# Patient Record
Sex: Female | Born: 1960 | Race: White | Hispanic: No | Marital: Single | Smoking: Current every day smoker
Health system: Southern US, Community
[De-identification: ages and names within clinical notes are randomized; demographics above are authoritative.]

## PROBLEM LIST (undated history)

## (undated) DIAGNOSIS — E079 Disorder of thyroid, unspecified: Secondary | ICD-10-CM

## (undated) HISTORY — PX: THYROIDECTOMY: SHX17

---

## 2002-09-26 ENCOUNTER — Emergency Department (HOSPITAL_COMMUNITY): Admission: EM | Admit: 2002-09-26 | Discharge: 2002-09-27 | Payer: Self-pay | Admitting: Emergency Medicine

## 2005-10-15 ENCOUNTER — Encounter: Admission: RE | Admit: 2005-10-15 | Discharge: 2005-10-15 | Payer: Self-pay | Admitting: Internal Medicine

## 2005-10-23 ENCOUNTER — Encounter: Admission: RE | Admit: 2005-10-23 | Discharge: 2005-10-23 | Payer: Self-pay | Admitting: Internal Medicine

## 2010-03-17 ENCOUNTER — Encounter: Payer: Self-pay | Admitting: Internal Medicine

## 2014-09-10 ENCOUNTER — Encounter (HOSPITAL_BASED_OUTPATIENT_CLINIC_OR_DEPARTMENT_OTHER): Payer: Self-pay | Admitting: Emergency Medicine

## 2014-09-10 ENCOUNTER — Emergency Department (HOSPITAL_BASED_OUTPATIENT_CLINIC_OR_DEPARTMENT_OTHER)
Admission: EM | Admit: 2014-09-10 | Discharge: 2014-09-10 | Disposition: A | Discharge status: Home / Self Care | Payer: Managed Care, Other (non HMO) | Attending: Emergency Medicine | Admitting: Emergency Medicine

## 2014-09-10 DIAGNOSIS — S81812A Laceration without foreign body, left lower leg, initial encounter: Secondary | ICD-10-CM | POA: Insufficient documentation

## 2014-09-10 DIAGNOSIS — W25XXXA Contact with sharp glass, initial encounter: Secondary | ICD-10-CM | POA: Insufficient documentation

## 2014-09-10 DIAGNOSIS — Y998 Other external cause status: Secondary | ICD-10-CM | POA: Insufficient documentation

## 2014-09-10 DIAGNOSIS — Y9289 Other specified places as the place of occurrence of the external cause: Secondary | ICD-10-CM | POA: Insufficient documentation

## 2014-09-10 DIAGNOSIS — Z720 Tobacco use: Secondary | ICD-10-CM | POA: Insufficient documentation

## 2014-09-10 DIAGNOSIS — Z880 Allergy status to penicillin: Secondary | ICD-10-CM | POA: Insufficient documentation

## 2014-09-10 DIAGNOSIS — Y9389 Activity, other specified: Secondary | ICD-10-CM | POA: Insufficient documentation

## 2014-09-10 DIAGNOSIS — Z23 Encounter for immunization: Secondary | ICD-10-CM | POA: Diagnosis not present

## 2014-09-10 DIAGNOSIS — Z8639 Personal history of other endocrine, nutritional and metabolic disease: Secondary | ICD-10-CM | POA: Insufficient documentation

## 2014-09-10 HISTORY — DX: Disorder of thyroid, unspecified: E07.9

## 2014-09-10 MED ORDER — LIDOCAINE-EPINEPHRINE 1 %-1:100000 IJ SOLN
INTRAMUSCULAR | Status: AC
  Administered 2014-09-10: 22:00:00
  Filled 2014-09-10: qty 1

## 2014-09-10 MED ORDER — TETANUS-DIPHTH-ACELL PERTUSSIS 5-2.5-18.5 LF-MCG/0.5 IM SUSP
0.5000 mL | Freq: Once | INTRAMUSCULAR | Status: AC
  Administered 2014-09-10: 0.5 mL via INTRAMUSCULAR
  Filled 2014-09-10: qty 0.5

## 2014-09-10 NOTE — ED Notes (Signed)
Pt in with laceration to anterior LLE just proximal to ankle. Bleeding controlled, laceration dressed, and pt able to flex and extend foot.

## 2014-09-10 NOTE — ED Provider Notes (Signed)
CSN: [id number]     Arrival date & time 09/10/14  1917 History   First MD Initiated Contact with Patient 09/10/14 2052     Chief Complaint  Patient presents with  . Extremity Laceration     (Consider location/radiation/quality/duration/timing/severity/associated sxs/prior Treatment) HPI   Blood pressure 134/92, pulse 80, temperature 98.3 F (36.8 C), temperature source Oral, resp. rate 18, SpO2 93 %.  Brenda Peterson is a 54 y.o. female complaining of laceration to anterior of left ankle onset hour prior to arrival. Patient states she was lifting her belongings out of a truck, she recently drove here from [place] she didn't realize at the frame of the picture she was lifting up had broken. A piece of glass fell into the leg cutting it. It did not shatter until his the ground. States that her pain is minimal, bleeding is controlled, last tetanus shot is unknown.  Past Medical History  Diagnosis Date  . Thyroid disease    Past Surgical History  Procedure Laterality Date  . Thyroidectomy     History reviewed. No pertinent family history. History  Substance Use Topics  . Smoking status: Current Every Day Smoker  . Smokeless tobacco: Never Used  . Alcohol Use: No   OB History    No data available     Review of Systems  10 systems reviewed and found to be negative, except as noted in the HPI.   Allergies  Penicillins  Home Medications   Prior to Admission medications   Not on File   BP 126/77 mmHg  Pulse 69  Temp(Src) 98.3 F (36.8 C) (Oral)  Resp 18  SpO2 94% Physical Exam  Constitutional: She is oriented to person, place, and time. She appears well-developed and well-nourished. No distress.  HENT:  Head: Normocephalic.  Eyes: Conjunctivae and EOM are normal.  Cardiovascular: Normal rate.   Pulmonary/Chest: Effort normal. No stridor.  Musculoskeletal: Normal range of motion.  Neurological: She is alert and oriented to person, place, and time.  Skin:  3 cm  flap-like laceration to anterior left shin, bleeding is controlled, no contamination  Psychiatric: She has a normal mood and affect.  Nursing note and vitals reviewed.   ED Course  LACERATION REPAIR Date/Time: 09/10/2014 9:43 PM Performed by: Wynetta Emery Authorized by: Wynetta Emery Consent: Verbal consent obtained. Consent given by: patient Required items: required blood products, implants, devices, and special equipment available Patient identity confirmed: verbally with patient Body area: lower extremity Location details: left ankle Laceration length: 3 cm Foreign bodies: no foreign bodies Tendon involvement: none Nerve involvement: none Anesthesia: local infiltration Local anesthetic: lidocaine 2% with epinephrine Anesthetic total: 5 ml Patient sedated: no Preparation: Patient was prepped and draped in the usual sterile fashion. Irrigation solution: saline Irrigation method: syringe Amount of cleaning: extensive Debridement: minimal Degree of undermining: none Skin closure: Ethilon (5-0) Number of sutures: 9 Suture technique: 4x running locking on either side of the flap and one corner stitch at the apex. Approximation: close Approximation difficulty: simple Dressing: antibiotic ointment Patient tolerance: Patient tolerated the procedure well with no immediate complications   (including critical care time) Labs Review Labs Reviewed - No data to display  Imaging Review No results found.   EKG Interpretation None      MDM   Final diagnoses:  Leg laceration, left, initial encounter    Filed Vitals:   09/10/14 1927 09/10/14 2145  BP: 134/92 126/77  Pulse: 80 69  Temp: 98.3 F (36.8 C)   TempSrc:  Oral   Resp: 18 18  SpO2: 93% 94%    Medications  lidocaine-EPINEPHrine (XYLOCAINE W/EPI) 1 %-1:100000 (with pres) injection (not administered)  Tdap (BOOSTRIX) injection 0.5 mL (not administered)    Brenda Peterson is a pleasant 54 y.o. female  presenting with laceration to anterior flexion, no gross contamination. Wound is irrigated profusely and closed. Patient is counseled on wound care and return precautions.  Evaluation does not show pathology that would require ongoing emergent intervention or inpatient treatment. Pt is hemodynamically stable and mentating appropriately. Discussed findings and plan with patient/guardian, who agrees with care plan. All questions answered. Return precautions discussed and outpatient follow up given.        Wynetta Emeryicole Cordarious Zeek, PA-C 09/10/14 2147  Brenda SouSam Jacubowitz, MD 09/11/14 [phone]

## 2014-09-10 NOTE — Discharge Instructions (Signed)
Keep wound dry and do not remove dressing for 24 hours if possible. After that, wash gently morning and night (every 12 hours) with soap and water. Use a topical antibiotic ointment and cover with a bandaid or gauze.  °  °Do NOT use rubbing alcohol or hydrogen peroxide, do not soak the area °  °Present to your primary care doctor or the urgent care of your choice, or the ED for suture removal in 8-10 days. °  °Every attempt was made to remove foreign body (contaminants) from the wound.  However, there is always a chance that some may remain in the wound. This can  increase your risk of infection. °  °If you see signs of infection (warmth, redness, tenderness, pus, sharp increase in pain, fever, red streaking in the skin) immediately return to the emergency department. °  °After the wound heals fully, apply sunscreen for 6-12 months to minimize scarring.  ° ° °Laceration Care, Adult °A laceration is a cut or lesion that goes through all layers of the skin and into the tissue just beneath the skin. °TREATMENT  °Some lacerations may not require closure. Some lacerations may not be able to be closed due to an increased risk of infection. It is important to see your caregiver as soon as possible after an injury to minimize the risk of infection and maximize the opportunity for successful closure. °If closure is appropriate, pain medicines may be given, if needed. The wound will be cleaned to help prevent infection. Your caregiver will use stitches (sutures), staples, wound glue (adhesive), or skin adhesive strips to repair the laceration. These tools bring the skin edges together to allow for faster healing and a better cosmetic outcome. However, all wounds will heal with a scar. Once the wound has healed, scarring can be minimized by covering the wound with sunscreen during the day for 1 full year. °HOME CARE INSTRUCTIONS  °For sutures or staples: °· Keep the wound clean and dry. °· If you were given a bandage  (dressing), you should change it at least once a day. Also, change the dressing if it becomes wet or dirty, or as directed by your caregiver. °· Wash the wound with soap and water 2 times a day. Rinse the wound off with water to remove all soap. Pat the wound dry with a clean towel. °· After cleaning, apply a thin layer of the antibiotic ointment as recommended by your caregiver. This will help prevent infection and keep the dressing from sticking. °· You may shower as usual after the first 24 hours. Do not soak the wound in water until the sutures are removed. °· Only take over-the-counter or prescription medicines for pain, discomfort, or fever as directed by your caregiver. °· Get your sutures or staples removed as directed by your caregiver. °For skin adhesive strips: °· Keep the wound clean and dry. °· Do not get the skin adhesive strips wet. You may bathe carefully, using caution to keep the wound dry. °· If the wound gets wet, pat it dry with a clean towel. °· Skin adhesive strips will fall off on their own. You may trim the strips as the wound heals. Do not remove skin adhesive strips that are still stuck to the wound. They will fall off in time. °For wound adhesive: °· You may briefly wet your wound in the shower or bath. Do not soak or scrub the wound. Do not swim. Avoid periods of heavy perspiration until the skin adhesive has fallen off   on its own. After showering or bathing, gently pat the wound dry with a clean towel. °· Do not apply liquid medicine, cream medicine, or ointment medicine to your wound while the skin adhesive is in place. This may loosen the film before your wound is healed. °· If a dressing is placed over the wound, be careful not to apply tape directly over the skin adhesive. This may cause the adhesive to be pulled off before the wound is healed. °· Avoid prolonged exposure to sunlight or tanning lamps while the skin adhesive is in place. Exposure to ultraviolet light in the first  year will darken the scar. °· The skin adhesive will usually remain in place for 5 to 10 days, then naturally fall off the skin. Do not pick at the adhesive film. °You may need a tetanus shot if: °· You cannot remember when you had your last tetanus shot. °· You have never had a tetanus shot. °If you get a tetanus shot, your arm may swell, get red, and feel warm to the touch. This is common and not a problem. If you need a tetanus shot and you choose not to have one, there is a rare chance of getting tetanus. Sickness from tetanus can be serious. °SEEK MEDICAL CARE IF:  °· You have redness, swelling, or increasing pain in the wound. °· You see a red line that goes away from the wound. °· You have yellowish-white fluid (pus) coming from the wound. °· You have a fever. °· You notice a bad smell coming from the wound or dressing. °· Your wound breaks open before or after sutures have been removed. °· You notice something coming out of the wound such as wood or glass. °· Your wound is on your hand or foot and you cannot move a finger or toe. °SEEK IMMEDIATE MEDICAL CARE IF:  °· Your pain is not controlled with prescribed medicine. °· You have severe swelling around the wound causing pain and numbness or a change in color in your arm, hand, leg, or foot. °· Your wound splits open and starts bleeding. °· You have worsening numbness, weakness, or loss of function of any joint around or beyond the wound. °· You develop painful lumps near the wound or on the skin anywhere on your body. °MAKE SURE YOU:  °· Understand these instructions. °· Will watch your condition. °· Will get help right away if you are not doing well or get worse. °Document Released: 02/10/2005 Document Revised: 05/05/2011 Document Reviewed: 08/06/2010 °[employer]® Patient Information ©2015 [employer]. This information is not intended to replace advice given to you by your health care provider. Make sure you discuss any questions you have with your health  care provider. ° ° °

## 2014-09-10 NOTE — ED Notes (Signed)
Avulsion lac to left ankle; bleeding controlled; 4x4 gauze and kling applied by nursefirst

## 2014-09-20 ENCOUNTER — Encounter (HOSPITAL_BASED_OUTPATIENT_CLINIC_OR_DEPARTMENT_OTHER): Payer: Self-pay | Admitting: *Deleted

## 2014-09-20 ENCOUNTER — Emergency Department (HOSPITAL_BASED_OUTPATIENT_CLINIC_OR_DEPARTMENT_OTHER)
Admission: EM | Admit: 2014-09-20 | Discharge: 2014-09-20 | Disposition: A | Discharge status: Home / Self Care | Payer: Managed Care, Other (non HMO) | Attending: Emergency Medicine | Admitting: Emergency Medicine

## 2014-09-20 DIAGNOSIS — Z8639 Personal history of other endocrine, nutritional and metabolic disease: Secondary | ICD-10-CM | POA: Insufficient documentation

## 2014-09-20 DIAGNOSIS — Z880 Allergy status to penicillin: Secondary | ICD-10-CM | POA: Insufficient documentation

## 2014-09-20 DIAGNOSIS — Z720 Tobacco use: Secondary | ICD-10-CM | POA: Insufficient documentation

## 2014-09-20 DIAGNOSIS — Z4802 Encounter for removal of sutures: Secondary | ICD-10-CM | POA: Diagnosis not present

## 2014-09-20 NOTE — ED Notes (Signed)
Here for suture removal, site on top of left foot, slight redness noted, pt denies any swelling, pain, drainage or fever since initial placement of sutures

## 2014-09-20 NOTE — ED Notes (Signed)
Here for suture removal

## 2014-09-20 NOTE — Discharge Instructions (Signed)

## 2014-09-20 NOTE — ED Provider Notes (Signed)
CSN: [id number]     Arrival date & time 09/20/14  1033 History   First MD Initiated Contact with Patient 09/20/14 1054     Chief Complaint  Patient presents with  . Suture / Staple Removal     (Consider location/radiation/quality/duration/timing/severity/associated sxs/prior Treatment) Patient is a 54 y.o. female presenting with suture removal. The history is provided by the patient.  Suture / Staple Removal This is a new problem. Episode onset: 10 days ago. The problem occurs constantly. The problem has been gradually improving. Associated symptoms comments: No redness, drainage, pain. Nothing aggravates the symptoms. Nothing relieves the symptoms.    Past Medical History  Diagnosis Date  . Thyroid disease    Past Surgical History  Procedure Laterality Date  . Thyroidectomy     History reviewed. No pertinent family history. History  Substance Use Topics  . Smoking status: Current Every Day Smoker  . Smokeless tobacco: Never Used  . Alcohol Use: No   OB History    No data available     Review of Systems  All other systems reviewed and are negative.     Allergies  Penicillins  Home Medications   Prior to Admission medications   Not on File   BP 121/76 mmHg  Pulse 62  Temp(Src) 98.4 F (36.9 C) (Oral)  Resp 16  Ht  (1.727 m)  Wt 136 lb (61.689 kg)  BMI 20.68 kg/m2  SpO2 93% Physical Exam  Constitutional: She is oriented to person, place, and time. She appears well-developed and well-nourished. No distress.  HENT:  Head: Normocephalic and atraumatic.  Eyes: EOM are normal. Pupils are equal, round, and reactive to light.  Cardiovascular: Normal rate.   Pulmonary/Chest: Effort normal.  Neurological: She is alert and oriented to person, place, and time.  Skin:     Psychiatric: She has a normal mood and affect. Her behavior is normal.  Nursing note and vitals reviewed.   ED Course  Procedures (including critical care time) Labs Review Labs  Reviewed - No data to display  Imaging Review No results found.   EKG Interpretation None      MDM   Final diagnoses:  Visit for suture removal   patient presents for suture removal. Wound is healing appropriately sutures removed and patient discharged home    Gwyneth Sprout, MD 09/20/14 1115

## 2016-01-11 ENCOUNTER — Other Ambulatory Visit: Payer: Self-pay | Admitting: Internal Medicine

## 2016-01-11 DIAGNOSIS — Z1231 Encounter for screening mammogram for malignant neoplasm of breast: Secondary | ICD-10-CM

## 2016-02-05 ENCOUNTER — Ambulatory Visit
Admission: RE | Admit: 2016-02-05 | Discharge: 2016-02-05 | Disposition: A | Discharge status: Home / Self Care | Payer: BLUE CROSS/BLUE SHIELD | Source: Ambulatory Visit | Attending: Internal Medicine | Admitting: Internal Medicine

## 2016-02-05 DIAGNOSIS — Z1231 Encounter for screening mammogram for malignant neoplasm of breast: Secondary | ICD-10-CM

## 2016-08-22 ENCOUNTER — Other Ambulatory Visit: Payer: Self-pay | Admitting: Internal Medicine

## 2016-08-22 DIAGNOSIS — R221 Localized swelling, mass and lump, neck: Secondary | ICD-10-CM

## 2016-08-28 ENCOUNTER — Other Ambulatory Visit: Payer: BLUE CROSS/BLUE SHIELD

## 2016-09-02 ENCOUNTER — Ambulatory Visit
Admission: RE | Admit: 2016-09-02 | Discharge: 2016-09-02 | Disposition: A | Discharge status: Home / Self Care | Payer: BLUE CROSS/BLUE SHIELD | Source: Ambulatory Visit | Attending: Internal Medicine | Admitting: Internal Medicine

## 2016-09-02 DIAGNOSIS — R221 Localized swelling, mass and lump, neck: Secondary | ICD-10-CM

## 2018-02-12 ENCOUNTER — Encounter: Payer: Self-pay | Admitting: Internal Medicine

## 2018-02-18 ENCOUNTER — Other Ambulatory Visit: Payer: Self-pay | Admitting: Internal Medicine

## 2018-02-19 ENCOUNTER — Encounter: Payer: Self-pay | Admitting: Nurse Practitioner

## 2018-05-11 IMAGING — MG DIGITAL SCREENING BILATERAL MAMMOGRAM WITH CAD
4 series · 4 of 4 positions shown · non-contrast
Comparison: Previous exam(s).

CLINICAL DATA: Screening.

EXAM:
DIGITAL SCREENING BILATERAL MAMMOGRAM WITH CAD

[L CC]
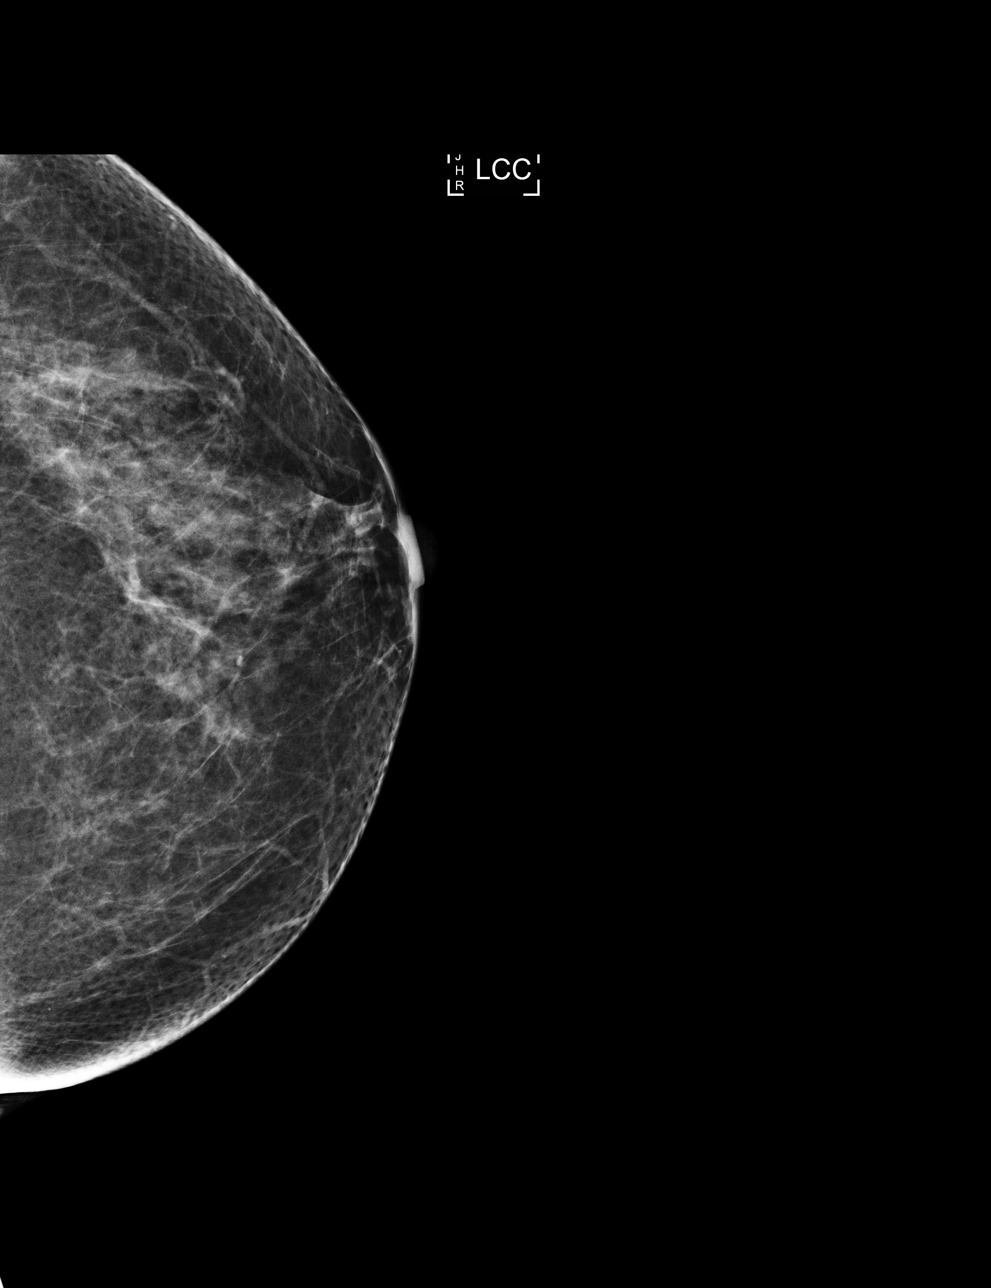

[R MLO]
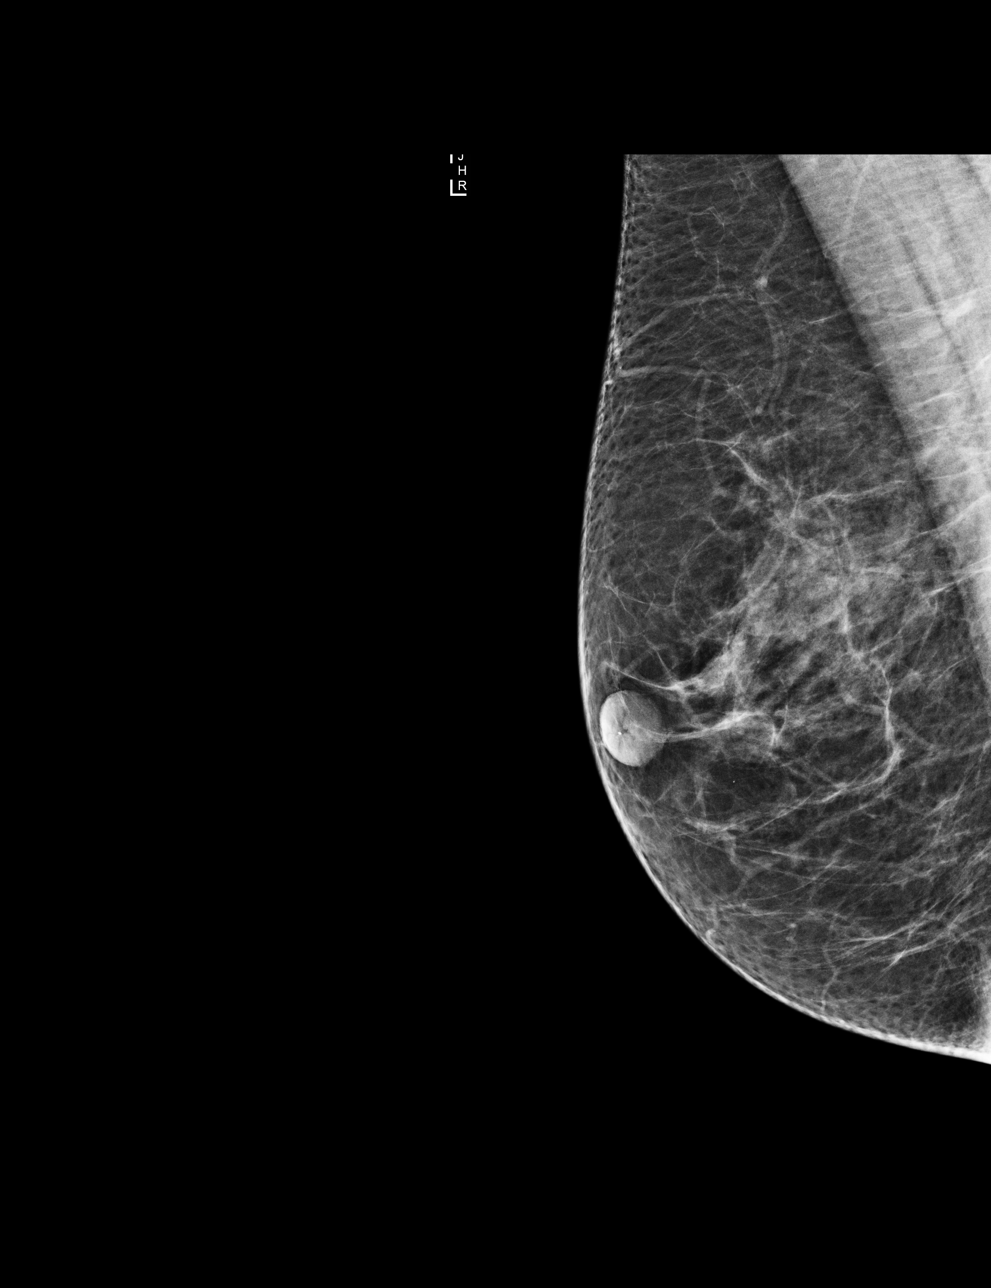

[R CC]
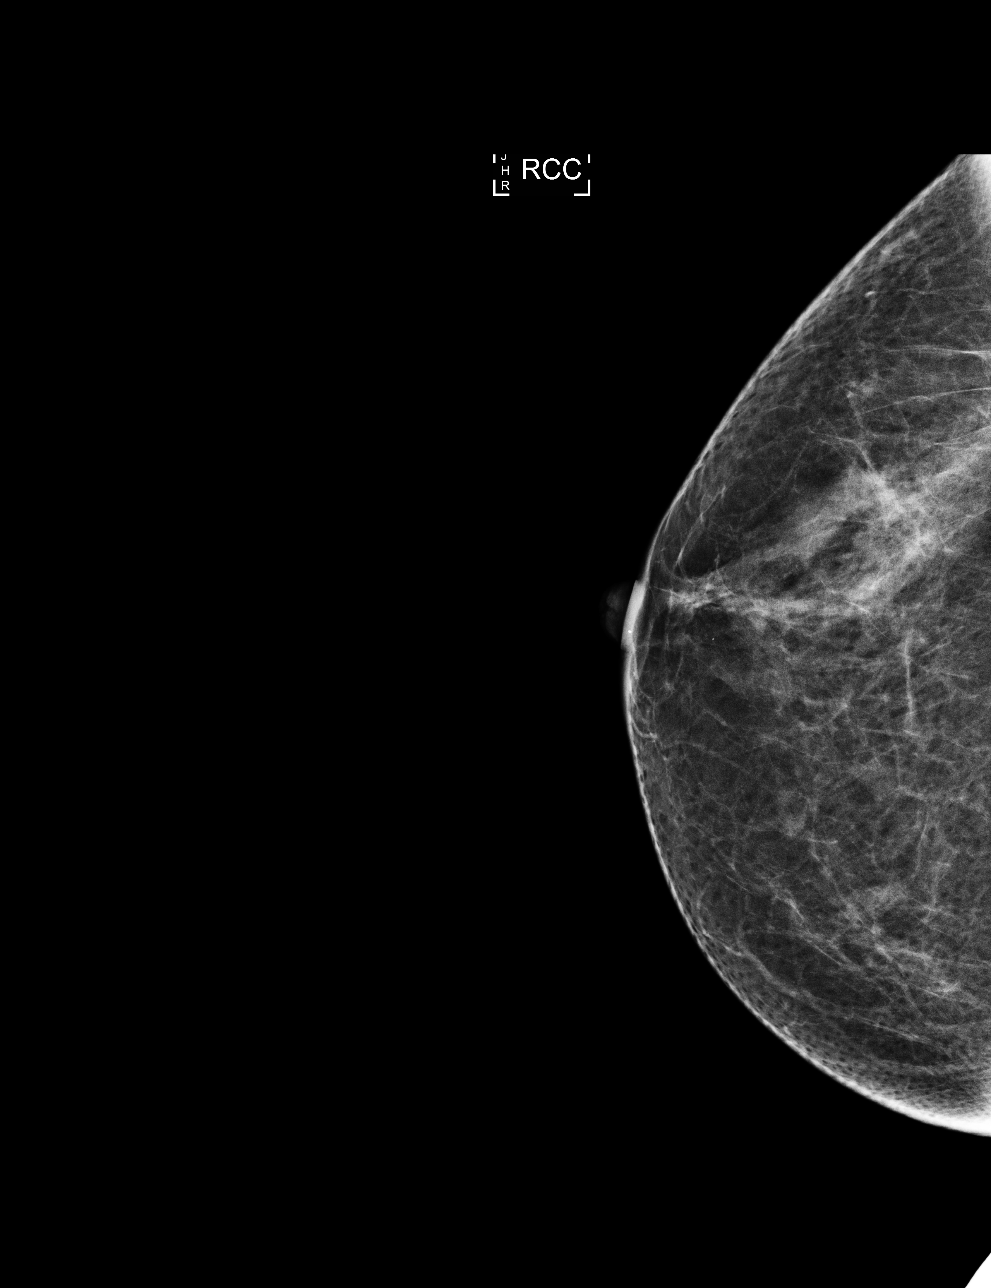

[L MLO]
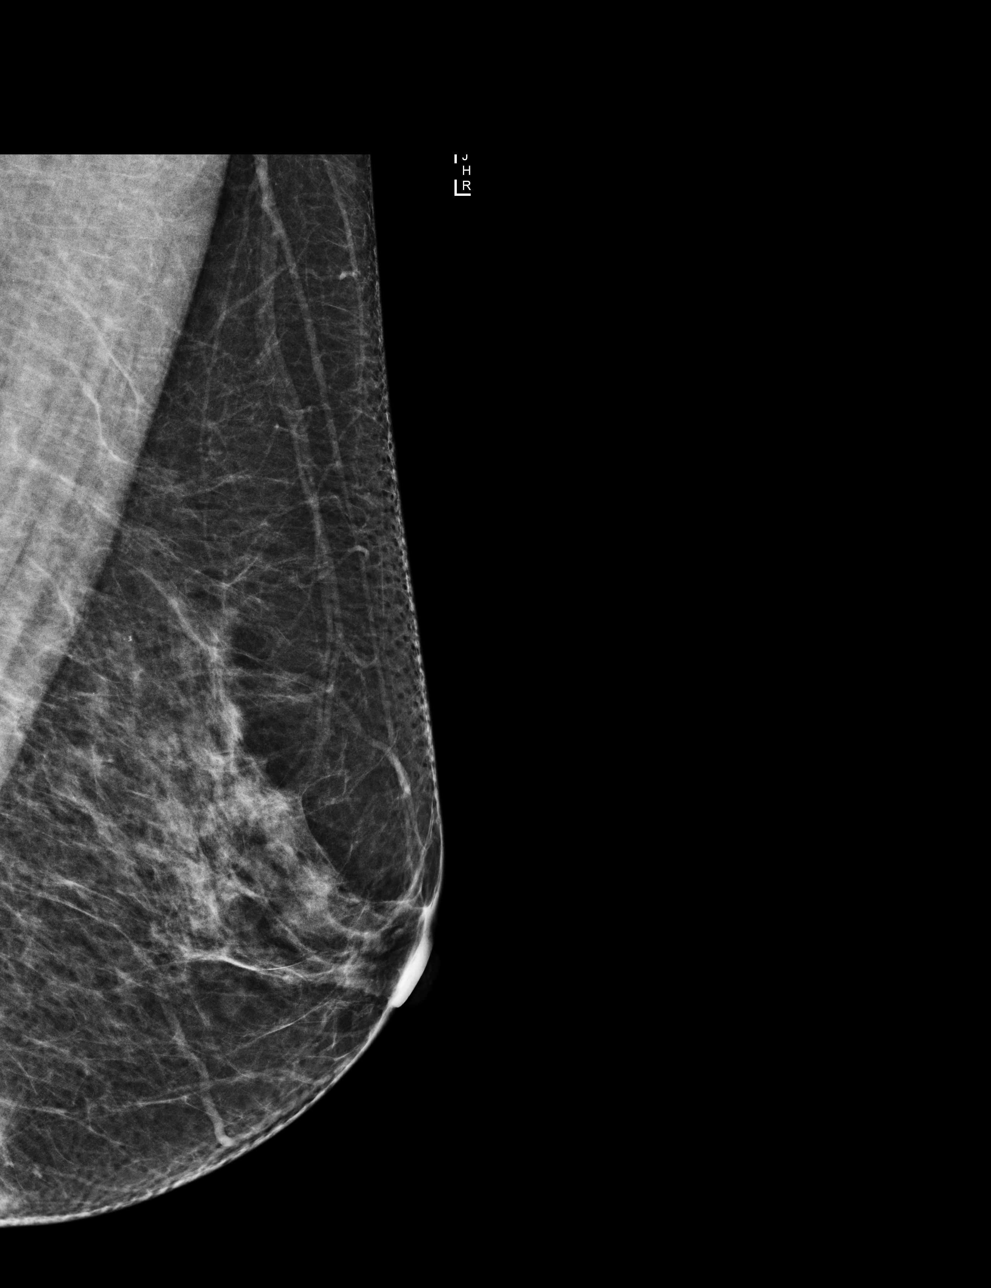

[4 of 4 positions shown; findings below may reference images not displayed]

ACR Breast Density Category b: There are scattered areas of
fibroglandular density.
FINDINGS: There are no findings suspicious for malignancy. Images were
processed with CAD.
IMPRESSION: No mammographic evidence of malignancy. A result letter of this
screening mammogram will be mailed directly to the patient.

RECOMMENDATION:
Screening mammogram in one year. (Code:AS-G-LCT)

BI-RADS CATEGORY  1: Negative.

## 2018-05-21 ENCOUNTER — Other Ambulatory Visit: Payer: Self-pay | Admitting: Internal Medicine

## 2018-10-03 IMAGING — US US THYROID
1 series · 13 of 25 positions shown · non-contrast
Comparison: None.

CLINICAL DATA: Palpable abnormality. Left-sided nodule on physical
examination. History of right lobectomy and partial left thyroid
lobectomy in 5000.

EXAM:
THYROID ULTRASOUND
TECHNIQUE: Ultrasound examination of the thyroid gland and adjacent soft
tissues was performed.

[Series 1: us thyroid · 0.04mm/px · 13 of 37 slices shown]
[im 1/37]
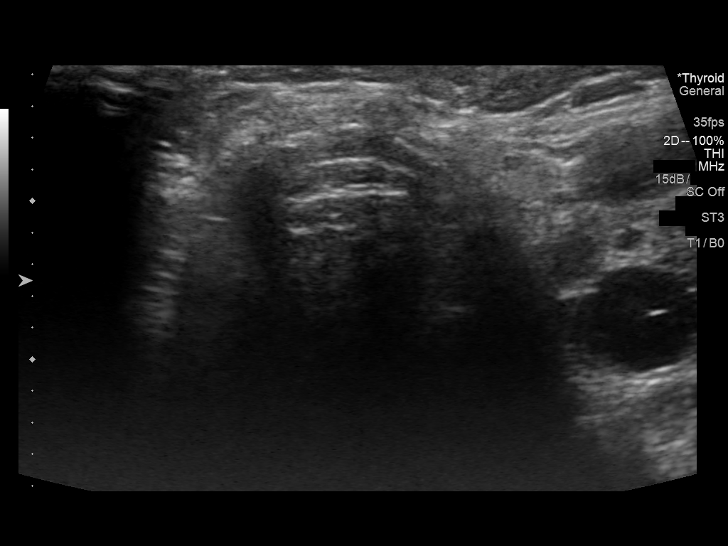
[im 4/37]
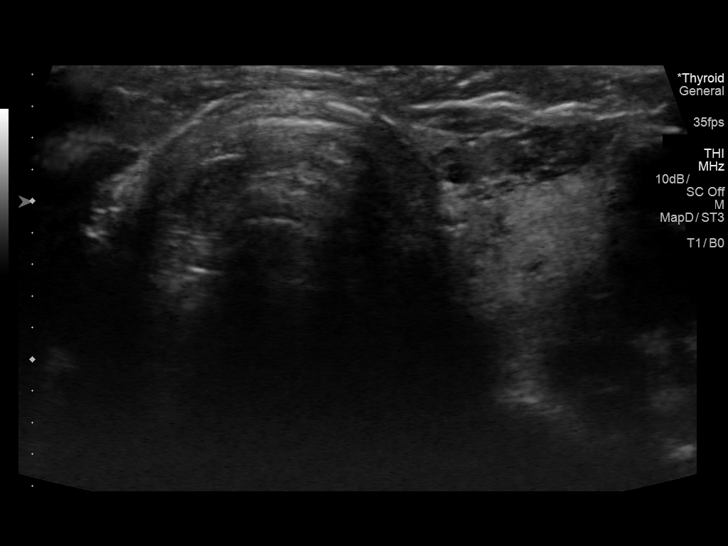
[im 7/37]
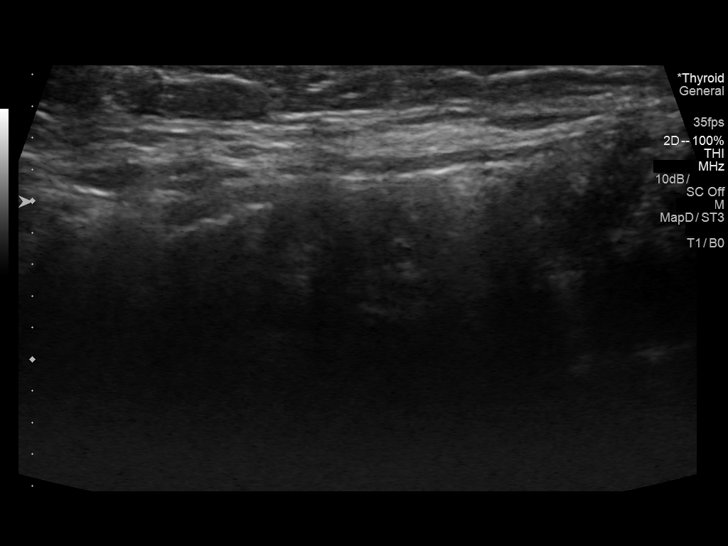
[im 10/37]
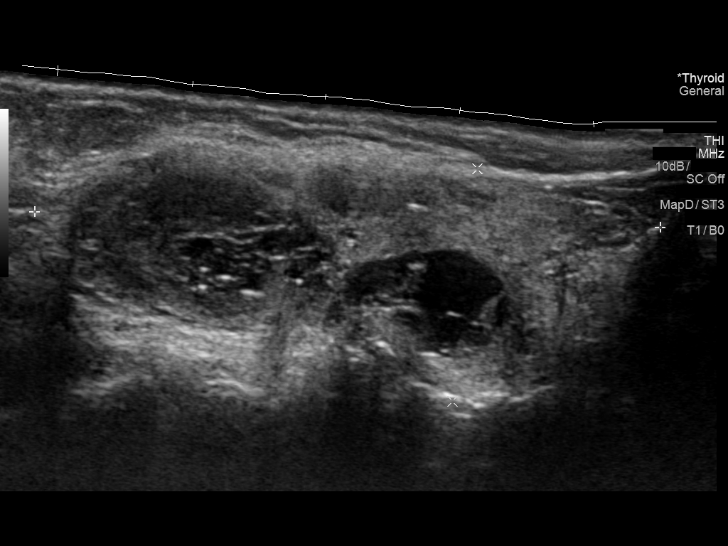
[im 13/37]
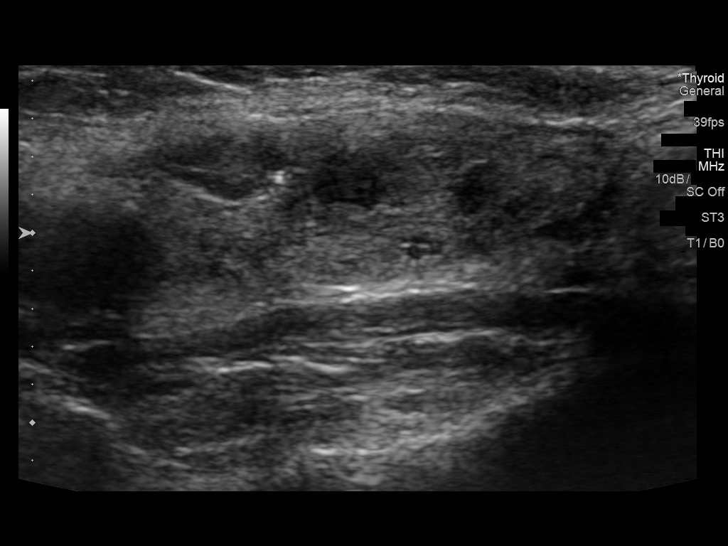
[im 16/37]
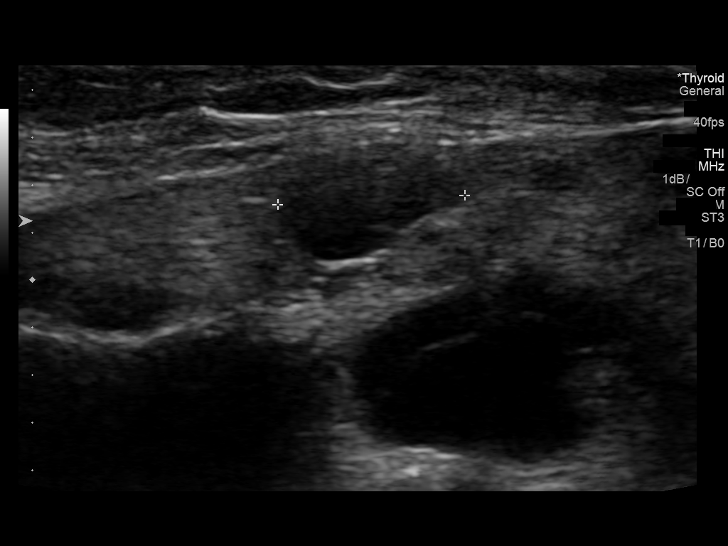
[im 19/37]
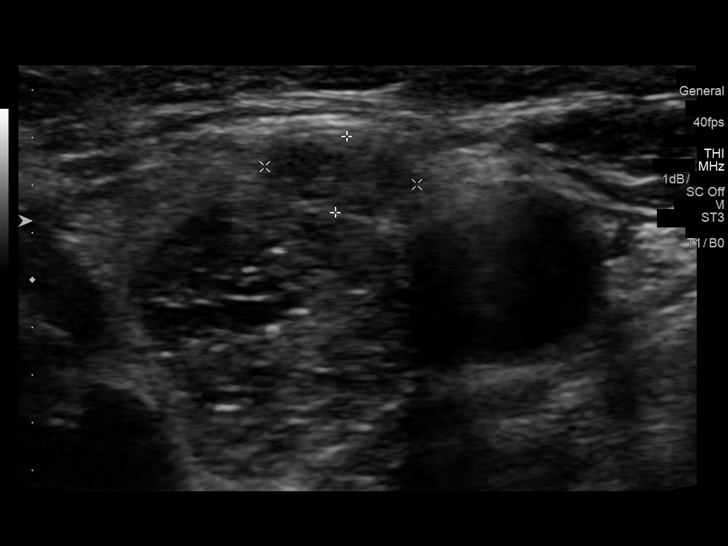
[im 22/37]
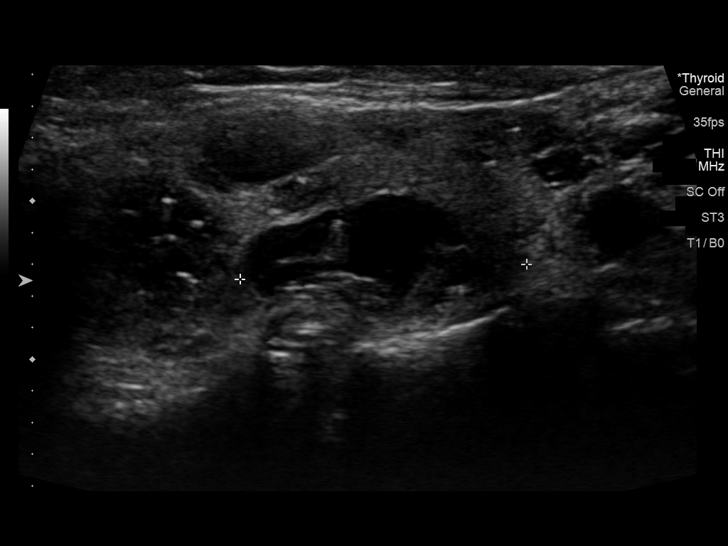
[im 25/37]
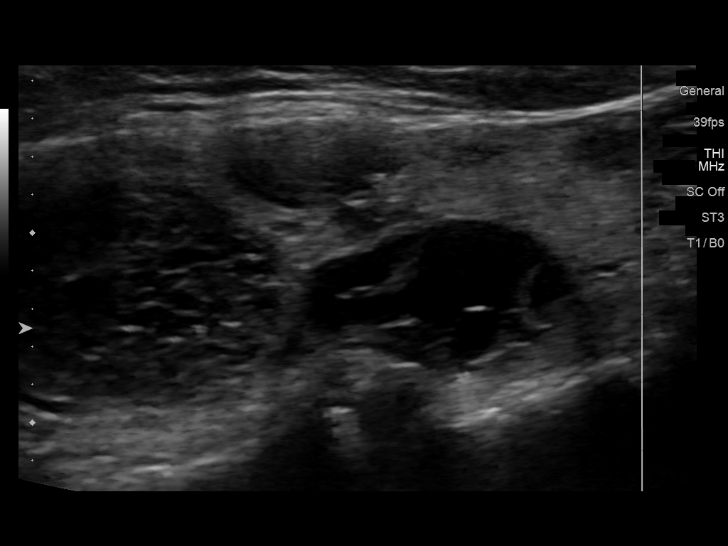
[im 28/37]
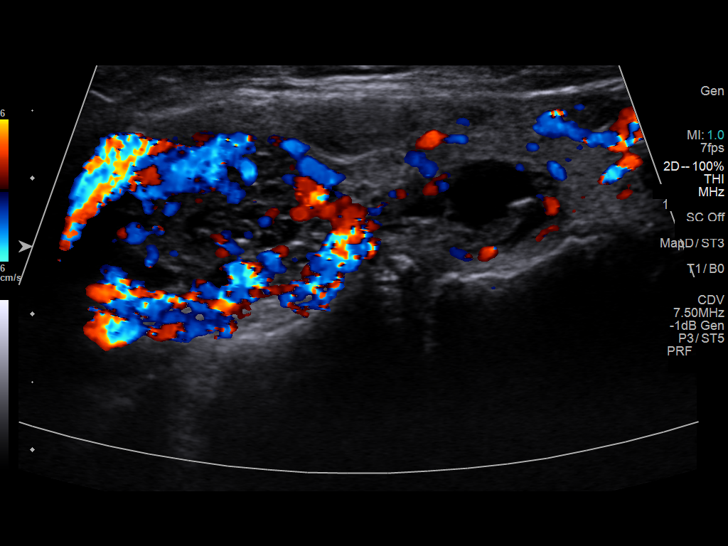
[im 31/37]
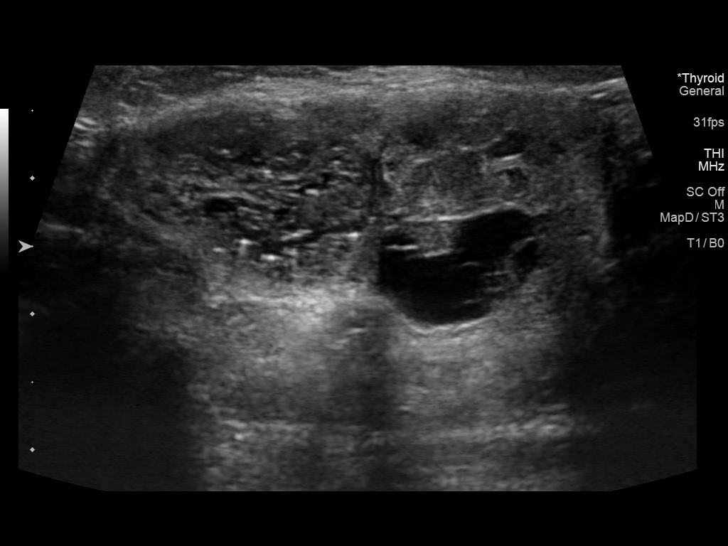
[im 34/37]
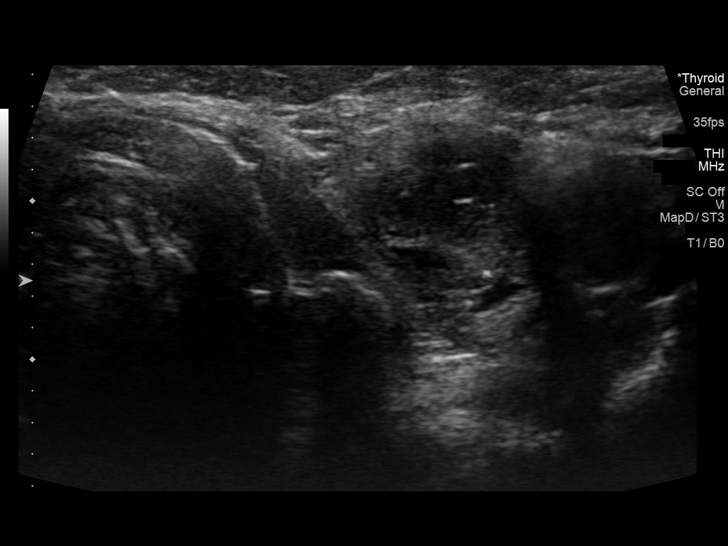
[im 37/37]
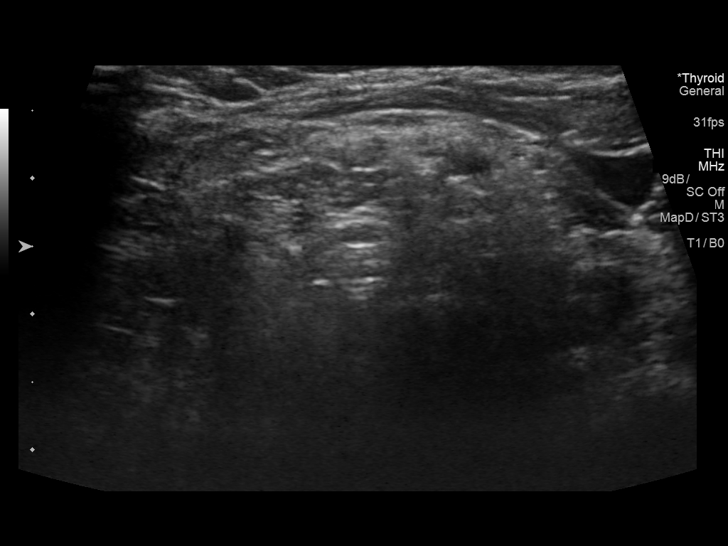

[13 of 25 positions shown; findings below may reference images not displayed]

FINDINGS: Parenchymal Echotexture: Moderately heterogenous

Isthmus: Enlarged measures 0.9 cm in diameter

Right lobe: Surgically absent. There is no residual nodular soft
tissue within the right lobectomy resection bed.

Left lobe: Normal in size measuring 4.7 x 1.7 x 2.4 cm

_________________________________________________________

Estimated total number of nodules >/= 1 cm: 3

Number of spongiform nodules >/=  2 cm not described below (TR1): 0

Number of mixed cystic and solid nodules >/= 1.5 cm not described
below (TR2): 0

_________________________________________________________

There is an approximately 2.2 x 1.2 x 1.2 cm spongiform nodule
within the superior pole the left lobe of the thyroid which is not
meet imaging criteria to recommend percutaneous sampling or
dedicated follow-up.

There are 2 adjacent punctate (sub 8 mm) hypoechoic likely cystic
nodules within the mid aspect of the left lobe of the thyroid which
do not meet imaging criteria to recommend percutaneous sampling or
dedicated follow-up.

There is a predominantly cystic approximately 1.8 cm nodule within
the posterior inferior aspect the left lobe of the thyroid which
does not meet imaging criteria to recommend percutaneous sampling or
dedicated follow-up.

Nodule # 4:

Location: Left; Inferior

Maximum size: 1.8 cm; Other 2 dimensions: 1.3 x 1.0 cm

Composition: solid/almost completely solid (2)

Echogenicity: isoechoic (1)

Shape: not taller-than-wide (0)

Margins: ill-defined (0)

Echogenic foci: none (0)

ACR TI-RADS total points: 3.

ACR TI-RADS risk category: TR3 (3 points).

ACR TI-RADS recommendations:

*Given size (>/= 1.5 - 2.4 cm) and appearance, a follow-up
ultrasound in 1 year should be considered based on TI-RADS criteria.
IMPRESSION: 1. Post right thyroid lobectomy without evidence of residual tissue
within the right lobectomy resection bed.
2. Findings suggestive of multinodular goiter within the remaining
left lobe of the thyroid.
3. Nodule #4 within the left lobe of the thyroid meets imaging
criteria to recommend a 1 year follow-up.
The above is in keeping with the ACR TI-RADS recommendations - [HOSPITAL] 6293;[DATE].
